# Patient Record
Sex: Female | Born: 1955 | Race: Black or African American | Hispanic: No | Marital: Married | Smoking: Never smoker
Health system: Southern US, Community
[De-identification: ages and names within clinical notes are randomized; demographics above are authoritative.]

## PROBLEM LIST (undated history)

## (undated) DIAGNOSIS — R1033 Periumbilical pain: Secondary | ICD-10-CM

## (undated) DIAGNOSIS — E7800 Pure hypercholesterolemia, unspecified: Secondary | ICD-10-CM

## (undated) DIAGNOSIS — Z789 Other specified health status: Secondary | ICD-10-CM

## (undated) DIAGNOSIS — K439 Ventral hernia without obstruction or gangrene: Principal | ICD-10-CM

## (undated) DIAGNOSIS — R110 Nausea: Secondary | ICD-10-CM

## (undated) HISTORY — PX: HERNIA REPAIR: SHX51

## (undated) HISTORY — DX: Nausea: R11.0

## (undated) HISTORY — PX: TUBAL LIGATION: SHX77

## (undated) HISTORY — PX: NO PAST SURGERIES: SHX2092

## (undated) HISTORY — DX: Ventral hernia without obstruction or gangrene: K43.9

## (undated) HISTORY — PX: COLONOSCOPY: SHX174

## (undated) HISTORY — DX: Periumbilical pain: R10.33

---

## 1998-02-03 ENCOUNTER — Emergency Department (HOSPITAL_COMMUNITY): Admission: EM | Admit: 1998-02-03 | Discharge: 1998-02-03 | Payer: Self-pay | Admitting: Emergency Medicine

## 1998-06-10 ENCOUNTER — Other Ambulatory Visit: Admission: RE | Admit: 1998-06-10 | Discharge: 1998-06-10 | Payer: Self-pay | Admitting: Obstetrics & Gynecology

## 1998-06-24 ENCOUNTER — Other Ambulatory Visit: Admission: RE | Admit: 1998-06-24 | Discharge: 1998-06-24 | Payer: Self-pay | Admitting: Obstetrics & Gynecology

## 1998-09-04 ENCOUNTER — Ambulatory Visit (HOSPITAL_COMMUNITY): Admission: RE | Admit: 1998-09-04 | Discharge: 1998-09-04 | Payer: Self-pay | Admitting: General Surgery

## 1998-09-04 ENCOUNTER — Encounter (HOSPITAL_BASED_OUTPATIENT_CLINIC_OR_DEPARTMENT_OTHER): Payer: Self-pay | Admitting: General Surgery

## 1998-09-11 ENCOUNTER — Encounter: Payer: Self-pay | Admitting: Emergency Medicine

## 1998-09-11 ENCOUNTER — Emergency Department (HOSPITAL_COMMUNITY): Admission: EM | Admit: 1998-09-11 | Discharge: 1998-09-11 | Payer: Self-pay | Admitting: Emergency Medicine

## 1998-09-12 ENCOUNTER — Encounter (HOSPITAL_BASED_OUTPATIENT_CLINIC_OR_DEPARTMENT_OTHER): Payer: Self-pay | Admitting: General Surgery

## 1998-09-16 ENCOUNTER — Ambulatory Visit (HOSPITAL_COMMUNITY): Admission: RE | Admit: 1998-09-16 | Discharge: 1998-09-16 | Payer: Self-pay | Admitting: General Surgery

## 1998-09-25 ENCOUNTER — Encounter: Admission: RE | Admit: 1998-09-25 | Discharge: 1998-10-23 | Payer: Self-pay | Admitting: Family Medicine

## 1998-10-21 ENCOUNTER — Other Ambulatory Visit: Admission: RE | Admit: 1998-10-21 | Discharge: 1998-10-21 | Payer: Self-pay | Admitting: Obstetrics and Gynecology

## 1999-02-03 ENCOUNTER — Other Ambulatory Visit: Admission: RE | Admit: 1999-02-03 | Discharge: 1999-02-03 | Payer: Self-pay | Admitting: Obstetrics and Gynecology

## 1999-05-22 ENCOUNTER — Other Ambulatory Visit: Admission: RE | Admit: 1999-05-22 | Discharge: 1999-05-22 | Payer: Self-pay | Admitting: Obstetrics and Gynecology

## 1999-11-24 ENCOUNTER — Other Ambulatory Visit: Admission: RE | Admit: 1999-11-24 | Discharge: 1999-11-24 | Payer: Self-pay | Admitting: Obstetrics and Gynecology

## 2000-07-09 ENCOUNTER — Other Ambulatory Visit: Admission: RE | Admit: 2000-07-09 | Discharge: 2000-07-09 | Payer: Self-pay | Admitting: Obstetrics and Gynecology

## 2000-07-14 ENCOUNTER — Encounter: Payer: Self-pay | Admitting: Obstetrics and Gynecology

## 2000-07-14 ENCOUNTER — Ambulatory Visit (HOSPITAL_COMMUNITY): Admission: RE | Admit: 2000-07-14 | Discharge: 2000-07-14 | Payer: Self-pay | Admitting: Obstetrics and Gynecology

## 2004-09-10 ENCOUNTER — Ambulatory Visit (HOSPITAL_COMMUNITY): Admission: RE | Admit: 2004-09-10 | Discharge: 2004-09-10 | Payer: Self-pay | Admitting: Internal Medicine

## 2010-01-27 ENCOUNTER — Other Ambulatory Visit: Admission: RE | Admit: 2010-01-27 | Discharge: 2010-01-27 | Payer: Self-pay | Admitting: Family Medicine

## 2010-02-11 ENCOUNTER — Encounter: Admission: RE | Admit: 2010-02-11 | Discharge: 2010-02-11 | Payer: Self-pay | Admitting: *Deleted

## 2011-01-08 ENCOUNTER — Other Ambulatory Visit: Payer: Self-pay | Admitting: Internal Medicine

## 2011-01-08 DIAGNOSIS — Z1231 Encounter for screening mammogram for malignant neoplasm of breast: Secondary | ICD-10-CM

## 2011-02-16 ENCOUNTER — Ambulatory Visit
Admission: RE | Admit: 2011-02-16 | Discharge: 2011-02-16 | Disposition: A | Discharge status: Home / Self Care | Payer: BC Managed Care – PPO | Source: Ambulatory Visit | Attending: Internal Medicine | Admitting: Internal Medicine

## 2011-02-16 DIAGNOSIS — Z1231 Encounter for screening mammogram for malignant neoplasm of breast: Secondary | ICD-10-CM

## 2011-02-23 ENCOUNTER — Other Ambulatory Visit: Payer: Self-pay | Admitting: Internal Medicine

## 2011-02-23 DIAGNOSIS — R928 Other abnormal and inconclusive findings on diagnostic imaging of breast: Secondary | ICD-10-CM

## 2011-02-27 ENCOUNTER — Other Ambulatory Visit: Payer: Self-pay | Admitting: Family Medicine

## 2011-03-03 ENCOUNTER — Encounter (INDEPENDENT_AMBULATORY_CARE_PROVIDER_SITE_OTHER): Payer: Self-pay | Admitting: Surgery

## 2011-03-04 ENCOUNTER — Other Ambulatory Visit: Payer: BC Managed Care – PPO

## 2011-03-10 ENCOUNTER — Ambulatory Visit
Admission: RE | Admit: 2011-03-10 | Discharge: 2011-03-10 | Disposition: A | Discharge status: Home / Self Care | Payer: BC Managed Care – PPO | Source: Ambulatory Visit | Attending: Family Medicine | Admitting: Family Medicine

## 2011-03-10 ENCOUNTER — Other Ambulatory Visit: Payer: BC Managed Care – PPO

## 2011-03-12 ENCOUNTER — Ambulatory Visit
Admission: RE | Admit: 2011-03-12 | Discharge: 2011-03-12 | Disposition: A | Discharge status: Home / Self Care | Payer: BC Managed Care – PPO | Source: Ambulatory Visit | Attending: Internal Medicine | Admitting: Internal Medicine

## 2011-03-12 DIAGNOSIS — R928 Other abnormal and inconclusive findings on diagnostic imaging of breast: Secondary | ICD-10-CM

## 2011-03-17 ENCOUNTER — Ambulatory Visit (INDEPENDENT_AMBULATORY_CARE_PROVIDER_SITE_OTHER): Payer: Self-pay | Admitting: Surgery

## 2011-04-07 ENCOUNTER — Encounter (INDEPENDENT_AMBULATORY_CARE_PROVIDER_SITE_OTHER): Payer: Self-pay | Admitting: Surgery

## 2011-04-07 ENCOUNTER — Ambulatory Visit (INDEPENDENT_AMBULATORY_CARE_PROVIDER_SITE_OTHER): Payer: BC Managed Care – PPO | Admitting: Surgery

## 2011-04-07 VITALS — BP 134/98 | HR 84 | Temp 97.3°F | Ht 65.0 in | Wt 162.6 lb

## 2011-04-07 DIAGNOSIS — K439 Ventral hernia without obstruction or gangrene: Secondary | ICD-10-CM | POA: Insufficient documentation

## 2011-04-07 HISTORY — DX: Ventral hernia without obstruction or gangrene: K43.9

## 2011-04-07 NOTE — Patient Instructions (Signed)
Call [phone] and ask for surgery scheduling when you are ready to schedule your surgery.

## 2011-04-07 NOTE — Progress Notes (Signed)
Patient ID: Alexa Wilkinson, female   DOB: 09/03/1955, 56 y.o.   MRN: [id number]  Chief Complaint  Patient presents with  . Pre-op Exam    eval peri umb abd pain    Alexa Wilkinson is a 56 y.o. female.  Referred by Dr. Cain Saupe for evaluation of painful mass above umbilicus. Alexa This patient presents with a 9 month history of worsening pain above her umbilicus.  She states that about 15 years ago she had a similar mass above her umbilicus and underwent an exploratory surgery. At that time no hernia was found. The pain went away for several years. However she has noticed worsening pain and swelling above her umbilicus. Her job requires a lot of lifting and pushing and this seems to exacerbate her symptoms. The area above her umbilicus has become quite firm and tender. She had a CT scan performed one month ago which was unremarkable other than some left kidney stones. On my review of the scan there appears to be a small soft tissue mass above the umbilicus with fat density. Past Medical History  Diagnosis Date  . Abdominal pain, periumbilic   . Nausea   . Ventral hernia 04/07/2011    History reviewed. No pertinent past surgical history.  History reviewed. No pertinent family history.  Social History History  Substance Use Topics  . Smoking status: Never Smoker   . Smokeless tobacco: Not on file  . Alcohol Use: No    Allergies  Allergen Reactions  . Penicillins Hives    Current Outpatient Prescriptions  Medication Sig Dispense Refill  . Biotin 300 MCG TABS Take by mouth.        . fish oil-omega-3 fatty acids 1000 MG capsule Take 2 g by mouth daily.        . naproxen sodium (ANAPROX) 220 MG tablet Take 220 mg by mouth 2 (two) times daily with a meal.          Review of Systems Review of Systems  Constitutional: Negative for fever, chills and unexpected weight change.  HENT: Negative for hearing loss, congestion, sore throat, trouble swallowing and voice change.   Eyes:  Negative for visual disturbance.  Respiratory: Negative for cough and wheezing.   Cardiovascular: Negative for chest pain, palpitations and leg swelling.  Gastrointestinal: Positive for abdominal pain. Negative for nausea, vomiting, diarrhea, constipation, blood in stool, abdominal distention and anal bleeding.  Genitourinary: Negative for hematuria, vaginal bleeding and difficulty urinating.  Musculoskeletal: Negative for arthralgias.  Skin: Negative for rash and wound.  Neurological: Negative for seizures, syncope and headaches.  Hematological: Negative for adenopathy. Does not bruise/bleed easily.  Psychiatric/Behavioral: Negative for confusion.    Blood pressure 134/98, pulse 84, temperature 97.3 F (36.3 C), temperature source Temporal, height 5\' 5"  (1.651 m), weight 162 lb 9.6 oz (73.755 kg), SpO2 98.00%.  Physical Exam Physical Exam WDWN in NAD HEENT:  EOMI, sclera anicteric Neck:  No masses, no thyromegaly Lungs:  CTA bilaterally; normal respiratory effort CV:  Regular rate and rhythm; no murmurs Abd:  +bowel sounds, soft, healed incision above the umbilicus.  In this area, there is an easily palpable 3 cm mass - tender to palpation, worse with Valsalva maneuver; non-reducible Ext:  Well-perfused; no edema Skin:  Warm, dry; no sign of jaundice  Data Reviewed CT scan of the abd/ pelvis 03/10/11   Assessment    Small ventral hernia above umbilicus - likely with incarcerated fat    Plan  Open ventral hernia repair, possibly with mesh.  The surgical procedure has been discussed with the patient.  Potential risks, benefits, alternative treatments, and expected outcomes have been explained.  All of the patient's questions at this time have been answered.  The likelihood of reaching the patient's treatment goal is good.  The patient understand the proposed surgical procedure and wishes to proceed. She will call back to schedule the surgery.       Antwonette Feliz  K. 04/07/2011, 4:24 PM

## 2011-05-05 ENCOUNTER — Encounter (HOSPITAL_BASED_OUTPATIENT_CLINIC_OR_DEPARTMENT_OTHER): Payer: Self-pay | Admitting: *Deleted

## 2011-05-05 NOTE — Progress Notes (Signed)
No labs ordered-none need per anesth.

## 2011-05-11 ENCOUNTER — Ambulatory Visit (HOSPITAL_BASED_OUTPATIENT_CLINIC_OR_DEPARTMENT_OTHER): Payer: BC Managed Care – PPO | Admitting: Certified Registered"

## 2011-05-11 ENCOUNTER — Encounter (HOSPITAL_BASED_OUTPATIENT_CLINIC_OR_DEPARTMENT_OTHER): Payer: Self-pay

## 2011-05-11 ENCOUNTER — Encounter (HOSPITAL_BASED_OUTPATIENT_CLINIC_OR_DEPARTMENT_OTHER)
Admission: RE | Disposition: A | Discharge status: Home / Self Care | Payer: Self-pay | Source: Ambulatory Visit | Attending: Surgery

## 2011-05-11 ENCOUNTER — Encounter (HOSPITAL_BASED_OUTPATIENT_CLINIC_OR_DEPARTMENT_OTHER): Payer: Self-pay | Admitting: Certified Registered"

## 2011-05-11 ENCOUNTER — Ambulatory Visit (HOSPITAL_BASED_OUTPATIENT_CLINIC_OR_DEPARTMENT_OTHER)
Admission: RE | Admit: 2011-05-11 | Discharge: 2011-05-11 | Disposition: A | Discharge status: Home / Self Care | Payer: BC Managed Care – PPO | Source: Ambulatory Visit | Attending: Surgery | Admitting: Surgery

## 2011-05-11 DIAGNOSIS — K439 Ventral hernia without obstruction or gangrene: Secondary | ICD-10-CM | POA: Insufficient documentation

## 2011-05-11 HISTORY — DX: Other specified health status: Z78.9

## 2011-05-11 HISTORY — PX: VENTRAL HERNIA REPAIR: SHX424

## 2011-05-11 SURGERY — REPAIR, HERNIA, VENTRAL
Anesthesia: General | Site: Abdomen | Wound class: Clean

## 2011-05-11 MED ORDER — MORPHINE SULFATE 2 MG/ML IJ SOLN: 0.0500 mg/kg | INTRAMUSCULAR | Status: DC | PRN

## 2011-05-11 MED ORDER — LACTATED RINGERS IV SOLN
INTRAVENOUS | Status: DC
  Administered 2011-05-11 (×2): via INTRAVENOUS

## 2011-05-11 MED ORDER — HYDROMORPHONE HCL PF 1 MG/ML IJ SOLN
0.2500 mg | INTRAMUSCULAR | Status: DC | PRN
  Administered 2011-05-11: 0.5 mg via INTRAVENOUS

## 2011-05-11 MED ORDER — FENTANYL CITRATE 0.05 MG/ML IJ SOLN
INTRAMUSCULAR | Status: DC | PRN
  Administered 2011-05-11: 25 ug via INTRAVENOUS
  Administered 2011-05-11: 50 ug via INTRAVENOUS

## 2011-05-11 MED ORDER — METOCLOPRAMIDE HCL 5 MG/ML IJ SOLN
INTRAMUSCULAR | Status: DC | PRN
  Administered 2011-05-11: 10 mg via INTRAVENOUS

## 2011-05-11 MED ORDER — MIDAZOLAM HCL 2 MG/2ML IJ SOLN: 0.5000 mg | INTRAMUSCULAR | Status: DC | PRN

## 2011-05-11 MED ORDER — LIDOCAINE HCL (CARDIAC) 20 MG/ML IV SOLN
INTRAVENOUS | Status: DC | PRN
  Administered 2011-05-11: 60 mg via INTRAVENOUS

## 2011-05-11 MED ORDER — DEXAMETHASONE SODIUM PHOSPHATE 4 MG/ML IJ SOLN
INTRAMUSCULAR | Status: DC | PRN
  Administered 2011-05-11: 8 mg via INTRAVENOUS

## 2011-05-11 MED ORDER — MIDAZOLAM HCL 5 MG/5ML IJ SOLN
INTRAMUSCULAR | Status: DC | PRN
  Administered 2011-05-11: 1 mg via INTRAVENOUS

## 2011-05-11 MED ORDER — ONDANSETRON HCL 4 MG/2ML IJ SOLN
INTRAMUSCULAR | Status: DC | PRN
  Administered 2011-05-11: 4 mg via INTRAVENOUS

## 2011-05-11 MED ORDER — PROPOFOL 10 MG/ML IV EMUL
INTRAVENOUS | Status: DC | PRN
  Administered 2011-05-11: 250 mg via INTRAVENOUS

## 2011-05-11 MED ORDER — METOCLOPRAMIDE HCL 5 MG/ML IJ SOLN: 10.0000 mg | Freq: Once | INTRAMUSCULAR | Status: DC | PRN

## 2011-05-11 MED ORDER — OXYCODONE-ACETAMINOPHEN 5-325 MG PO TABS: 1.0000 | ORAL_TABLET | ORAL | Status: AC | PRN

## 2011-05-11 MED ORDER — BUPIVACAINE-EPINEPHRINE 0.25% -1:200000 IJ SOLN
INTRAMUSCULAR | Status: DC | PRN
  Administered 2011-05-11: 20 mL

## 2011-05-11 MED ORDER — CEFAZOLIN SODIUM 1-5 GM-% IV SOLN
1.0000 g | INTRAVENOUS | Status: AC
  Administered 2011-05-11: 1 g via INTRAVENOUS

## 2011-05-11 MED ORDER — ACETAMINOPHEN 10 MG/ML IV SOLN
1000.0000 mg | Freq: Once | INTRAVENOUS | Status: AC
  Administered 2011-05-11: 1000 mg via INTRAVENOUS

## 2011-05-11 SURGICAL SUPPLY — 53 items
APL SKNCLS STERI-STRIP NONHPOA (GAUZE/BANDAGES/DRESSINGS)
BENZOIN TINCTURE PRP APPL 2/3 (GAUZE/BANDAGES/DRESSINGS) IMPLANT
BLADE SURG 10 STRL SS (BLADE) ×2 IMPLANT
BLADE SURG 15 STRL LF DISP TIS (BLADE) ×1 IMPLANT
BLADE SURG 15 STRL SS (BLADE) ×2
BLADE SURG ROTATE 9660 (MISCELLANEOUS) ×1 IMPLANT
CANISTER SUCTION 1200CC (MISCELLANEOUS) ×2 IMPLANT
CHLORAPREP W/TINT 26ML (MISCELLANEOUS) ×2 IMPLANT
CLEANER CAUTERY TIP 5X5 PAD (MISCELLANEOUS) ×1 IMPLANT
CLOTH BEACON ORANGE TIMEOUT ST (SAFETY) ×2 IMPLANT
COVER MAYO STAND STRL (DRAPES) ×2 IMPLANT
COVER TABLE BACK 60X90 (DRAPES) ×2 IMPLANT
DECANTER SPIKE VIAL GLASS SM (MISCELLANEOUS) ×1 IMPLANT
DRAPE LAPAROTOMY T 102X78X121 (DRAPES) ×2 IMPLANT
DRAPE UTILITY XL STRL (DRAPES) ×2 IMPLANT
DRSG TEGADERM 4X4.75 (GAUZE/BANDAGES/DRESSINGS) ×2 IMPLANT
ELECT REM PT RETURN 9FT ADLT (ELECTROSURGICAL) ×2
ELECTRODE REM PT RTRN 9FT ADLT (ELECTROSURGICAL) ×1 IMPLANT
GAUZE SPONGE 4X4 12PLY STRL LF (GAUZE/BANDAGES/DRESSINGS) ×4 IMPLANT
GLOVE BIO SURGEON STRL SZ7 (GLOVE) ×1 IMPLANT
GLOVE BIO SURGEON STRL SZ7.5 (GLOVE) ×1 IMPLANT
GLOVE BIOGEL M STRL SZ7.5 (GLOVE) ×1 IMPLANT
GLOVE BIOGEL PI IND STRL 8 (GLOVE) IMPLANT
GLOVE BIOGEL PI INDICATOR 8 (GLOVE) ×1
GOWN PREVENTION PLUS XLARGE (GOWN DISPOSABLE) ×2 IMPLANT
GOWN PREVENTION PLUS XXLARGE (GOWN DISPOSABLE) ×1 IMPLANT
NDL HYPO 25X1 1.5 SAFETY (NEEDLE) ×1 IMPLANT
NDL SPNL 25GX3.5 QUINCKE BL (NEEDLE) IMPLANT
NEEDLE HYPO 25X1 1.5 SAFETY (NEEDLE) ×2 IMPLANT
NEEDLE SPNL 25GX3.5 QUINCKE BL (NEEDLE) IMPLANT
NS IRRIG 1000ML POUR BTL (IV SOLUTION) ×1 IMPLANT
PACK BASIN DAY SURGERY FS (CUSTOM PROCEDURE TRAY) ×2 IMPLANT
PAD CLEANER CAUTERY TIP 5X5 (MISCELLANEOUS) ×1
PENCIL BUTTON HOLSTER BLD 10FT (ELECTRODE) ×2 IMPLANT
SLEEVE SCD COMPRESS KNEE MED (MISCELLANEOUS) ×1 IMPLANT
SPONGE LAP 18X18 X RAY DECT (DISPOSABLE) ×2 IMPLANT
STAPLER VISISTAT 35W (STAPLE) IMPLANT
STRIP CLOSURE SKIN 1/2X4 (GAUZE/BANDAGES/DRESSINGS) IMPLANT
SUT MNCRL AB 4-0 PS2 18 (SUTURE) ×2 IMPLANT
SUT PROLENE 0 CT 1 CR/8 (SUTURE) IMPLANT
SUT PROLENE 2 0 CT2 30 (SUTURE) IMPLANT
SUT SILK 2 0 TIES 17X18 (SUTURE)
SUT SILK 2-0 18XBRD TIE BLK (SUTURE) IMPLANT
SUT SURG 0 T 19/GS 22 1969 62 (SUTURE) ×1 IMPLANT
SUT VIC AB 3-0 SH 27 (SUTURE) ×2
SUT VIC AB 3-0 SH 27X BRD (SUTURE) ×1 IMPLANT
SYR BULB 3OZ (MISCELLANEOUS) IMPLANT
SYR CONTROL 10ML LL (SYRINGE) ×2 IMPLANT
TOWEL OR 17X24 6PK STRL BLUE (TOWEL DISPOSABLE) ×2 IMPLANT
TOWEL OR NON WOVEN STRL DISP B (DISPOSABLE) ×2 IMPLANT
TUBE CONNECTING 20X1/4 (TUBING) ×2 IMPLANT
WATER STERILE IRR 1000ML POUR (IV SOLUTION) ×1 IMPLANT
YANKAUER SUCT BULB TIP NO VENT (SUCTIONS) ×2 IMPLANT

## 2011-05-11 NOTE — Transfer of Care (Signed)
Immediate Anesthesia Transfer of Care Note  Patient: Alexa Wilkinson  Procedure(s) Performed:  HERNIA REPAIR VENTRAL ADULT - open vental hernia repair  Patient Location: PACU  Anesthesia Type: General  Level of Consciousness: awake, alert , oriented and patient cooperative  Airway & Oxygen Therapy: Patient Spontanous Breathing and Patient connected to face mask oxygen  Post-op Assessment: Report given to PACU RN and Post -op Vital signs reviewed and stable  Post vital signs: Reviewed and stable  Complications: No apparent anesthesia complications

## 2011-05-11 NOTE — H&P (Signed)
Progress Notes   ANAHLIA ISEMINGER (MR# [id number])       Progress Notes Info       Author Note Status Last Update User Last Update Date/Time    Wilmon Arms. Corliss Skains, MD Signed Wilmon Arms. Corliss Skains, MD 04/07/2011  4:30 PM      Progress Notes     Patient ID: Alexa Wilkinson, female   DOB: 1955/04/24, 56 y.o.   MRN: [id number]    Chief Complaint   Patient presents with   .  Pre-op Exam       eval peri umb abd pain        HPI Alexa Wilkinson is a 56 y.o. female.  Referred by Dr. Cain Saupe for evaluation of painful mass above umbilicus. HPI This patient presents with a 9 month history of worsening pain above her umbilicus.  She states that about 15 years ago she had a similar mass above her umbilicus and underwent an exploratory surgery. At that time no hernia was found. The pain went away for several years. However she has noticed worsening pain and swelling above her umbilicus. Her job requires a lot of lifting and pushing and this seems to exacerbate her symptoms. The area above her umbilicus has become quite firm and tender. She had a CT scan performed one month ago which was unremarkable other than some left kidney stones. On my review of the scan there appears to be a small soft tissue mass above the umbilicus with fat density. Past Medical History   Diagnosis  Date   .  Abdominal pain, periumbilic     .  Nausea     .  Ventral hernia  04/07/2011        PSH - exploratory surgery for umbilical mass   History reviewed. No pertinent family history.   Social History History   Substance Use Topics   .  Smoking status:  Never Smoker    .  Smokeless tobacco:  Not on file   .  Alcohol Use:  No         Allergies   Allergen  Reactions   .  Penicillins  Hives         Current Outpatient Prescriptions   Medication  Sig  Dispense  Refill   .  Biotin 300 MCG TABS  Take by mouth.           .  fish oil-omega-3 fatty acids 1000 MG capsule  Take 2 g by mouth daily.           .   naproxen sodium (ANAPROX) 220 MG tablet  Take 220 mg by mouth 2 (two) times daily with a meal.                Review of Systems Review of Systems  Constitutional: Negative for fever, chills and unexpected weight change.  HENT: Negative for hearing loss, congestion, sore throat, trouble swallowing and voice change.   Eyes: Negative for visual disturbance.  Respiratory: Negative for cough and wheezing.   Cardiovascular: Negative for chest pain, palpitations and leg swelling.  Gastrointestinal: Positive for abdominal pain. Negative for nausea, vomiting, diarrhea, constipation, blood in stool, abdominal distention and anal bleeding.  Genitourinary: Negative for hematuria, vaginal bleeding and difficulty urinating.  Musculoskeletal: Negative for arthralgias.  Skin: Negative for rash and wound.  Neurological: Negative for seizures, syncope and headaches.  Hematological: Negative for adenopathy. Does not bruise/bleed easily.  Psychiatric/Behavioral: Negative for confusion.  Blood pressure 134/98, pulse 84, temperature 97.3 F (36.3 C), temperature source Temporal, height 5\' 5"  (1.651 m), weight 162 lb 9.6 oz (73.755 kg), SpO2 98.00%.   Physical Exam  WDWN in NAD HEENT:  EOMI, sclera anicteric Neck:  No masses, no thyromegaly Lungs:  CTA bilaterally; normal respiratory effort CV:  Regular rate and rhythm; no murmurs Abd:  +bowel sounds, soft, healed incision above the umbilicus.  In this area, there is an easily palpable 3 cm mass - tender to palpation, worse with Valsalva maneuver; non-reducible Ext:  Well-perfused; no edema Skin:  Warm, dry; no sign of jaundice   Data Reviewed CT scan of the abd/ pelvis 03/10/11      Assessment    Small ventral hernia above umbilicus - likely with incarcerated fat     Plan    Open ventral hernia repair, possibly with mesh.  The surgical procedure has been discussed with the patient.  Potential risks, benefits, alternative treatments,  and expected outcomes have been explained.  All of the patient's questions at this time have been answered.  The likelihood of reaching the patient's treatment goal is good.  The patient understand the proposed surgical procedure and wishes to proceed.      Wilmon Arms. Corliss Skains, MD, [facility] Surgery  05/11/2011 11:25 AM

## 2011-05-11 NOTE — Anesthesia Procedure Notes (Signed)
Procedure Name: LMA Insertion Date/Time: 05/11/2011 2:17 PM Performed by: Radford Pax Pre-anesthesia Checklist: Patient identified, Emergency Drugs available, Suction available, Patient being monitored and Timeout performed Patient Re-evaluated:Patient Re-evaluated prior to inductionOxygen Delivery Method: Circle System Utilized Preoxygenation: Pre-oxygenation with 100% oxygen Intubation Type: IV induction Ventilation: Mask ventilation without difficulty LMA: LMA inserted LMA Size: 4.0 Number of attempts: 1 (atraumatic) Airway Equipment and Method: bite block Placement Confirmation: positive ETCO2 Tube secured with: Tape Dental Injury: Teeth and Oropharynx as per pre-operative assessment

## 2011-05-11 NOTE — Anesthesia Postprocedure Evaluation (Signed)
Anesthesia Post Note  Patient: Alexa Wilkinson  Procedure(s) Performed: Procedure(s) (LRB): HERNIA REPAIR VENTRAL ADULT (N/A)  Anesthesia type: General  Patient location: PACU  Post pain: Pain level controlled  Post assessment: Patient's Cardiovascular Status Stable  Last Vitals:  Filed Vitals:   05/11/11 1545  BP: 138/78  Pulse: 66  Temp:   Resp: 13    Post vital signs: Reviewed and stable  Level of consciousness: alert  Complications: No apparent anesthesia complications

## 2011-05-11 NOTE — Op Note (Signed)
Indications:  The patient presented with a history of a supraumbilical hernia.  The patient was examined and we recommended ventral hernia repair with mesh.  Pre-operative diagnosis:  Supraumbilical ventral hernia  Post-operative diagnosis:  Same  Surgeon: Dartha Rozzell K.   Procedure:  Open repair of supraumbilical ventral hernia  Assistants: None  Anesthesia: General LMA anesthesia  ASA Class: 2   Procedure Details  The patient was seen again in the Holding Room. The risks, benefits, complications, treatment options, and expected outcomes were discussed with the patient. The possibilities of reaction to medication, pulmonary aspiration, perforation of viscus, bleeding, recurrent infection, the need for additional procedures, and development of a complication requiring transfusion or further operation were discussed with the patient and/or family. There was concurrence with the proposed plan, and informed consent was obtained. The site of surgery was properly noted/marked. The patient was taken to the Operating Room, identified as Alexa Wilkinson, and the procedure verified as umbilical hernia repair. A Time Out was held and the above information confirmed.  After an adequate level of general anesthesia was obtained, the patient's abdomen was prepped with Chloraprep and draped in sterile fashion.  We made a vertical incision above the umbilicus, including her previous incision.  Dissection was carried down to the hernia sac with cautery.  We dissected bluntly around the hernia sac down to the edge of the fascial defect.  The hernia sac contained only some preperitoneal fat, but measured about 2.5 cm across. The fascial defect measured 0.5 cm.  We cleared the fascia in all directions.  The fascial defect was closed with multiple interrupted figure-of-eight 1 Novofil sutures.  3-0 Vicryl was used to close the subcutaneous tissues and 4-0 Monocryl was used to close the skin.  Steri-strips and clean  dressing were applied.  The patient was extubated and brought to the recovery room in stable condition.  All sponge, instrument, and needle counts were correct prior to closure and at the conclusion of the case.   Estimated Blood Loss: Minimal          Complications: None; patient tolerated the procedure well.         Disposition: PACU - hemodynamically stable.         Condition: stable

## 2011-05-11 NOTE — Anesthesia Preprocedure Evaluation (Signed)
Anesthesia Evaluation  Patient identified by MRN, date of birth, ID band Patient awake    Reviewed: Allergy & Precautions, H&P , NPO status , Patient's Chart, lab work & pertinent test results, reviewed documented beta blocker date and time   Airway Mallampati: II TM Distance: >3 FB Neck ROM: full    Dental   Pulmonary neg pulmonary ROS,          Cardiovascular neg cardio ROS     Neuro/Psych Negative Neurological ROS  Negative Psych ROS   GI/Hepatic negative GI ROS, Neg liver ROS,   Endo/Other  Negative Endocrine ROS  Renal/GU negative Renal ROS  Genitourinary negative   Musculoskeletal   Abdominal   Peds  Hematology negative hematology ROS (+)   Anesthesia Other Findings See surgeon's H&P   Reproductive/Obstetrics negative OB ROS                           Anesthesia Physical Anesthesia Plan  ASA: II  Anesthesia Plan: General   Post-op Pain Management:    Induction: Intravenous  Airway Management Planned: LMA  Additional Equipment:   Intra-op Plan:   Post-operative Plan: Extubation in OR  Informed Consent: I have reviewed the patients History and Physical, chart, labs and discussed the procedure including the risks, benefits and alternatives for the proposed anesthesia with the patient or authorized representative who has indicated his/her understanding and acceptance.     Plan Discussed with: CRNA and Surgeon  Anesthesia Plan Comments:         Anesthesia Quick Evaluation  

## 2011-05-12 ENCOUNTER — Encounter (HOSPITAL_BASED_OUTPATIENT_CLINIC_OR_DEPARTMENT_OTHER): Payer: Self-pay | Admitting: Surgery

## 2011-05-12 LAB — POCT HEMOGLOBIN-HEMACUE: Hemoglobin: 12.7 g/dL (ref 12.0–15.0)

## 2011-05-26 ENCOUNTER — Encounter (INDEPENDENT_AMBULATORY_CARE_PROVIDER_SITE_OTHER): Payer: Self-pay | Admitting: Surgery

## 2011-05-26 ENCOUNTER — Encounter (INDEPENDENT_AMBULATORY_CARE_PROVIDER_SITE_OTHER): Payer: Self-pay | Admitting: General Surgery

## 2011-05-26 ENCOUNTER — Ambulatory Visit (INDEPENDENT_AMBULATORY_CARE_PROVIDER_SITE_OTHER): Payer: BC Managed Care – PPO | Admitting: Surgery

## 2011-05-26 VITALS — BP 146/94 | HR 70 | Temp 98.2°F | Resp 18 | Ht 66.0 in | Wt 158.2 lb

## 2011-05-26 DIAGNOSIS — K439 Ventral hernia without obstruction or gangrene: Secondary | ICD-10-CM

## 2011-05-26 NOTE — Progress Notes (Signed)
S/p open repair of small ventral hernia on 2/11.  This was repaired with suture.  Doing well.  Incision well-healed with no sign of infection.  No sign of recurrent hernia.  Resume full activity.  Follow-up PRN.  Wilmon Arms. Corliss Skains, MD, [facility] Surgery  05/26/2011 2:09 PM

## 2012-02-04 ENCOUNTER — Other Ambulatory Visit: Payer: Self-pay | Admitting: Family Medicine

## 2012-02-04 DIAGNOSIS — Z1231 Encounter for screening mammogram for malignant neoplasm of breast: Secondary | ICD-10-CM

## 2012-03-18 ENCOUNTER — Ambulatory Visit: Payer: BC Managed Care – PPO

## 2012-03-21 ENCOUNTER — Ambulatory Visit: Payer: BC Managed Care – PPO

## 2012-03-31 ENCOUNTER — Ambulatory Visit
Admission: RE | Admit: 2012-03-31 | Discharge: 2012-03-31 | Disposition: A | Discharge status: Home / Self Care | Payer: BC Managed Care – PPO | Source: Ambulatory Visit | Attending: Family Medicine | Admitting: Family Medicine

## 2012-03-31 DIAGNOSIS — Z1231 Encounter for screening mammogram for malignant neoplasm of breast: Secondary | ICD-10-CM

## 2012-04-01 ENCOUNTER — Ambulatory Visit: Payer: BC Managed Care – PPO

## 2012-06-02 IMAGING — CT CT ABD-PELV W/O CM
3 of 4 series · 13 of 36 positions shown, 19 images · IV contrast (READICAT/WATER)
Comparison: None.

CLINICAL DATA: Periumbilical pain for several months

CT ABDOMEN AND PELVIS WITHOUT CONTRAST
TECHNIQUE: Multidetector CT imaging of the abdomen and pelvis was
performed following the standard protocol without intravenous
contrast.

[Series 3: abd/pelvis w/o · axial · non-contrast · 0.70mm/px · z∈[-307,-7]mm · 8 of 78 slices shown, 13 images]
[im 9/78  soft-tissue]
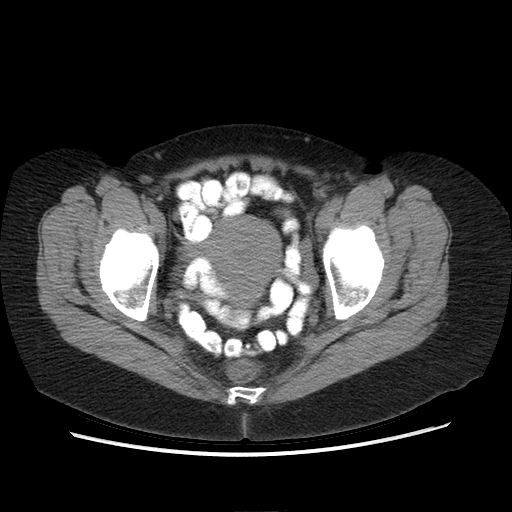
[im 9/78  bone]
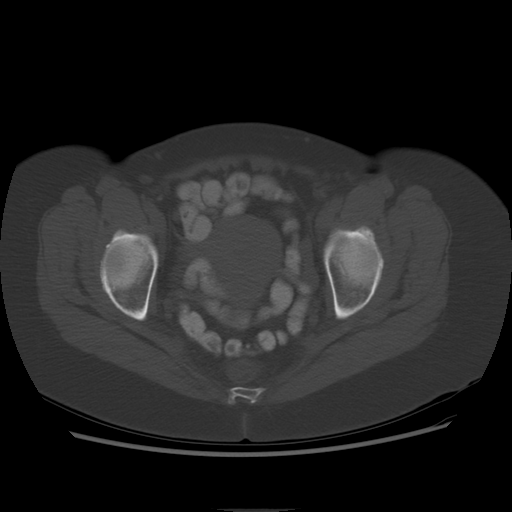
[im 18/78  soft-tissue]
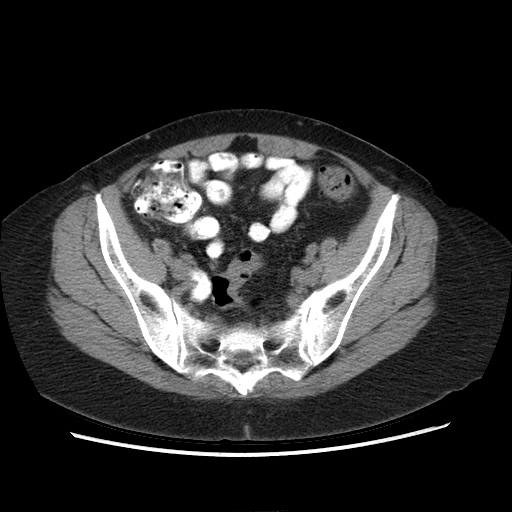
[im 26/78  soft-tissue]
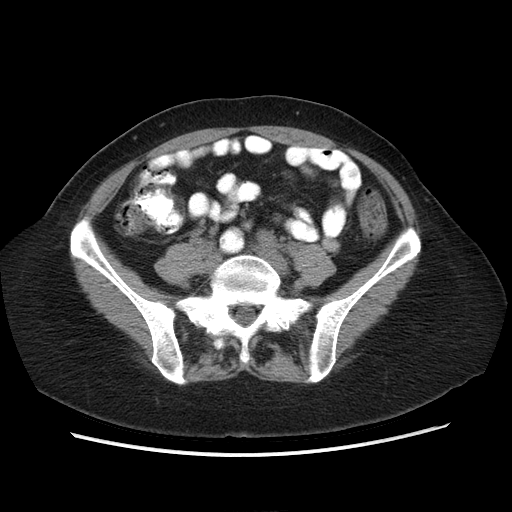
[im 35/78  soft-tissue]
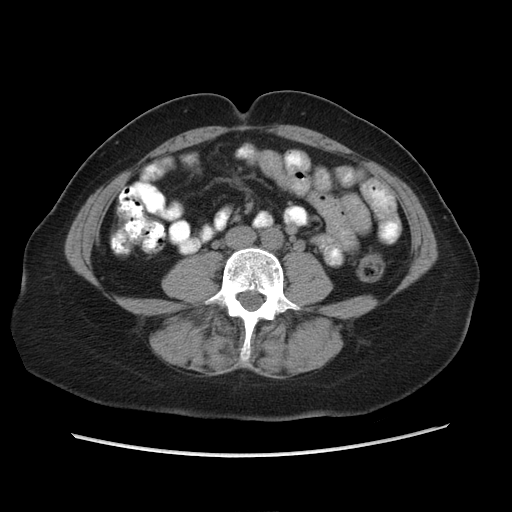
[im 43/78  soft-tissue]
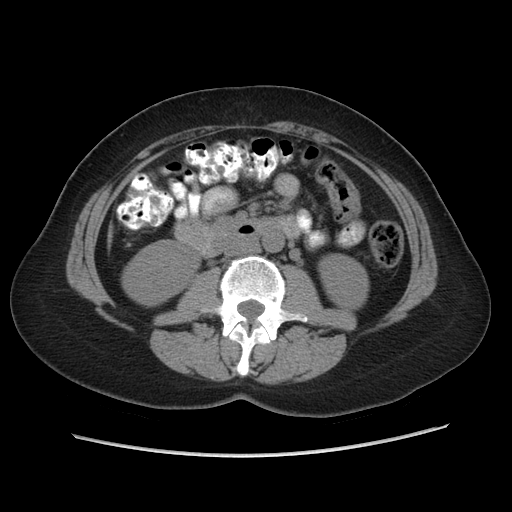
[im 43/78  lung]
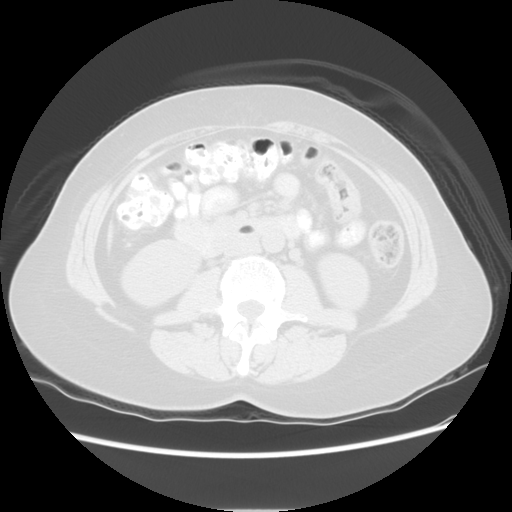
[im 52/78  soft-tissue]
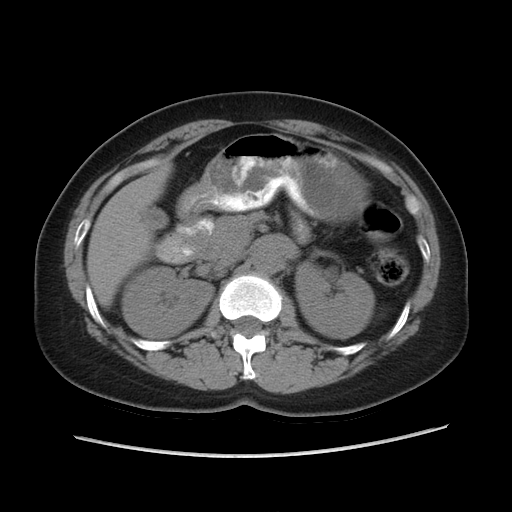
[im 52/78  lung]
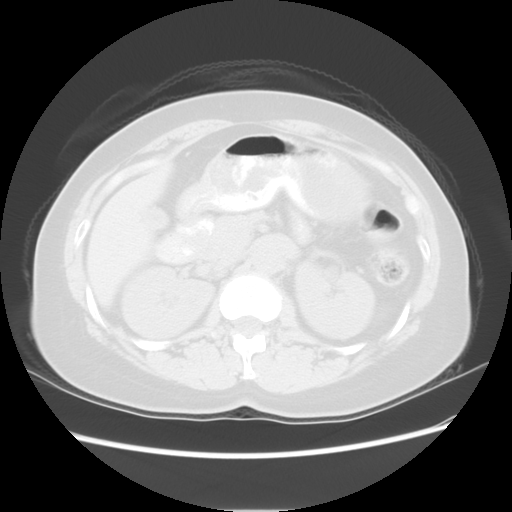
[im 60/78  soft-tissue]
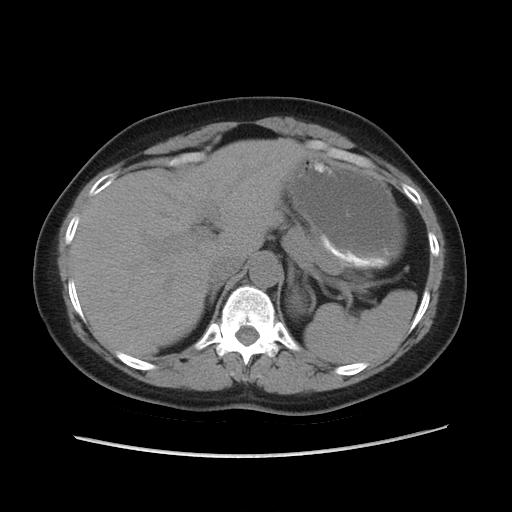
[im 60/78  lung]
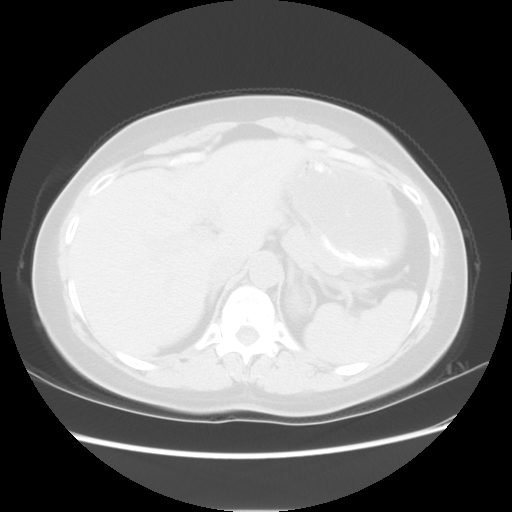
[im 69/78  soft-tissue]
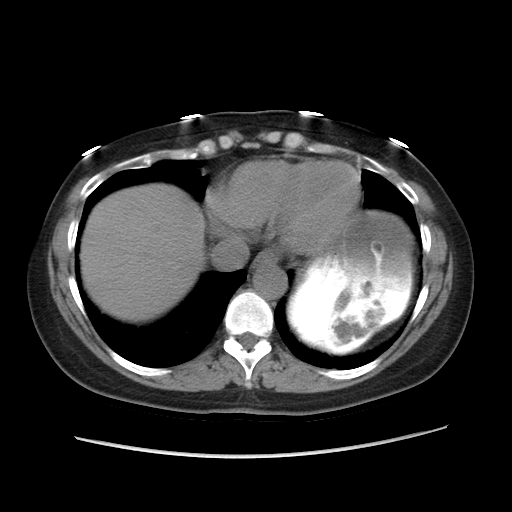
[im 69/78  lung]
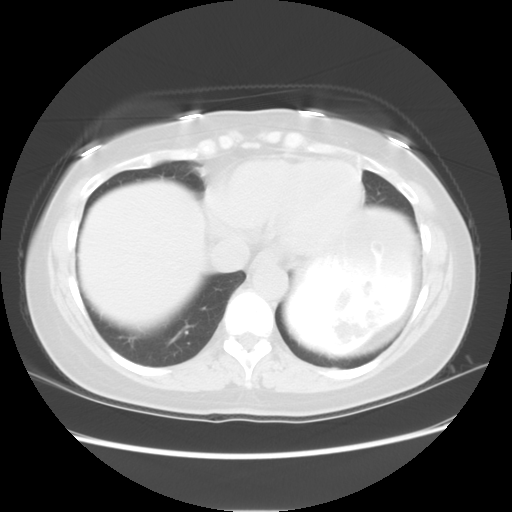

[Series 601: coronal body · coronal · 0.84mm/px · 1 of 107 slices shown, 2 images]
[im 36/107  soft-tissue]
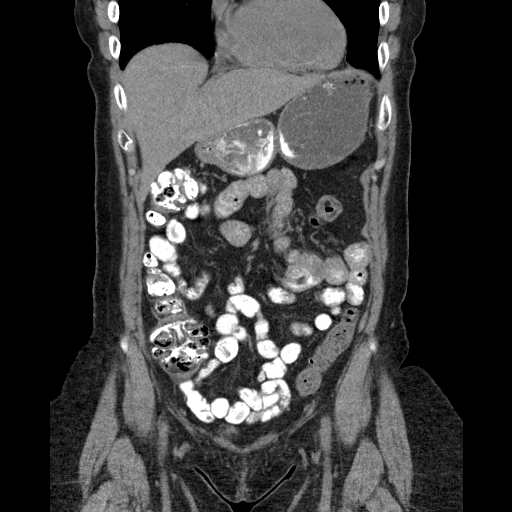
[im 36/107  bone]
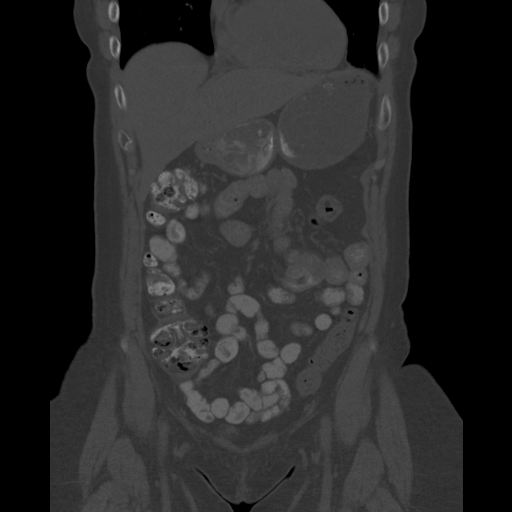

[Series 602: sagittal body · sagittal · 0.84mm/px · 4 of 145 slices shown]
[im 17/145  soft-tissue]
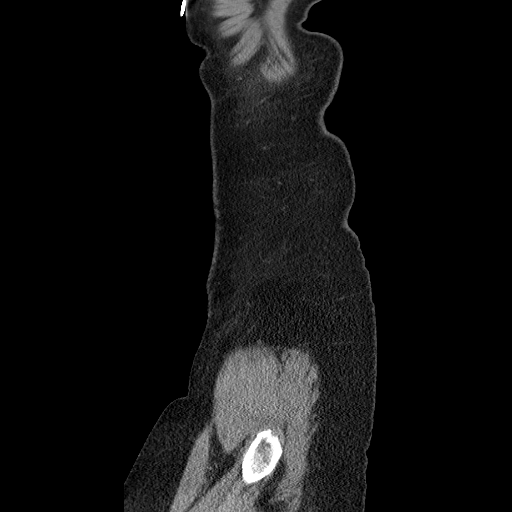
[im 33/145  soft-tissue]
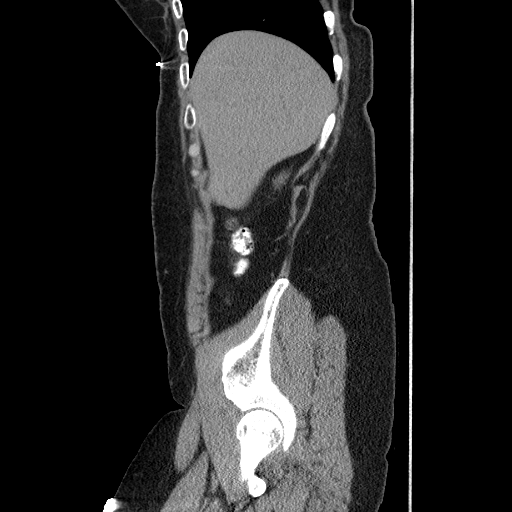
[im 49/145  soft-tissue]
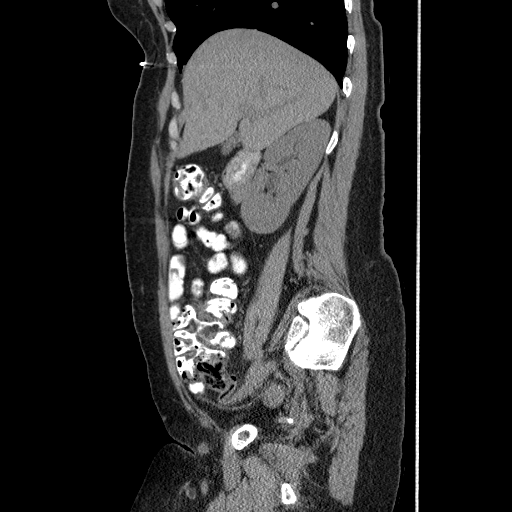
[im 65/145  soft-tissue]
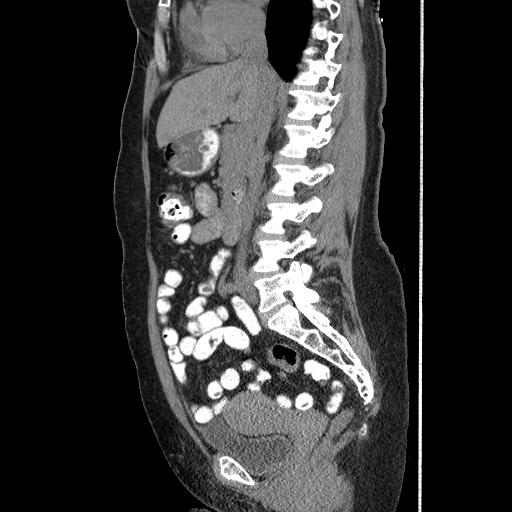

[13 of 36 positions shown; findings below may reference images not displayed]

FINDINGS: The lung bases are clear.  The liver is unremarkable in
the unenhanced state.  The gallbladder is contracted and no
calcified gallstones are seen.  The pancreas is normal in size and
the pancreatic duct is not dilated.  The adrenal glands and spleen
are unremarkable.  The stomach is moderately fluid distended with
contrast media and is unremarkable.  Small nonobstructing left
renal calculi are seen.  No definite right renal calculi are noted.
The ureters are normal in caliber.  The abdominal aorta appears
normal.  No adenopathy is seen.

No distal ureteral calculi are noted.  The uterus is normal in
size.  The urinary bladder is not well distended.  No free fluid is
seen within the pelvis.  There are rectosigmoid colonic diverticula
present. The appendix and terminal ileum are unremarkable.  No bony
abnormality is seen.
IMPRESSION: 1.  Small nonobstructing left renal calculi.  No right renal
calculi.  No hydronephrosis.
2.  The appendix and terminal ileum appear normal.
3.  Rectosigmoid colonic diverticula.

## 2013-02-16 ENCOUNTER — Other Ambulatory Visit (HOSPITAL_COMMUNITY)
Admission: RE | Admit: 2013-02-16 | Discharge: 2013-02-16 | Disposition: A | Discharge status: Home / Self Care | Payer: BC Managed Care – PPO | Source: Ambulatory Visit | Attending: Family Medicine | Admitting: Family Medicine

## 2013-02-16 ENCOUNTER — Other Ambulatory Visit: Payer: Self-pay | Admitting: Family Medicine

## 2013-02-16 DIAGNOSIS — Z0000 Encounter for general adult medical examination without abnormal findings: Secondary | ICD-10-CM | POA: Insufficient documentation

## 2013-02-17 ENCOUNTER — Other Ambulatory Visit: Payer: Self-pay | Admitting: Family Medicine

## 2013-02-17 DIAGNOSIS — N949 Unspecified condition associated with female genital organs and menstrual cycle: Secondary | ICD-10-CM

## 2013-02-27 ENCOUNTER — Ambulatory Visit
Admission: RE | Admit: 2013-02-27 | Discharge: 2013-02-27 | Disposition: A | Discharge status: Home / Self Care | Payer: BC Managed Care – PPO | Source: Ambulatory Visit | Attending: Family Medicine | Admitting: Family Medicine

## 2013-02-27 DIAGNOSIS — N949 Unspecified condition associated with female genital organs and menstrual cycle: Secondary | ICD-10-CM

## 2013-03-21 ENCOUNTER — Other Ambulatory Visit: Payer: Self-pay

## 2013-03-21 DIAGNOSIS — Z1231 Encounter for screening mammogram for malignant neoplasm of breast: Secondary | ICD-10-CM

## 2013-04-17 ENCOUNTER — Ambulatory Visit: Payer: BC Managed Care – PPO

## 2013-04-26 ENCOUNTER — Ambulatory Visit
Admission: RE | Admit: 2013-04-26 | Discharge: 2013-04-26 | Disposition: A | Discharge status: Home / Self Care | Payer: Self-pay | Source: Ambulatory Visit

## 2013-04-26 ENCOUNTER — Other Ambulatory Visit: Payer: Self-pay

## 2013-04-26 DIAGNOSIS — Z1231 Encounter for screening mammogram for malignant neoplasm of breast: Secondary | ICD-10-CM

## 2014-03-27 ENCOUNTER — Other Ambulatory Visit: Payer: Self-pay

## 2014-03-27 DIAGNOSIS — Z1231 Encounter for screening mammogram for malignant neoplasm of breast: Secondary | ICD-10-CM

## 2014-04-27 ENCOUNTER — Encounter (INDEPENDENT_AMBULATORY_CARE_PROVIDER_SITE_OTHER): Payer: Self-pay

## 2014-04-27 ENCOUNTER — Ambulatory Visit
Admission: RE | Admit: 2014-04-27 | Discharge: 2014-04-27 | Disposition: A | Discharge status: Home / Self Care | Payer: BC Managed Care – PPO | Source: Ambulatory Visit

## 2014-04-27 DIAGNOSIS — Z1231 Encounter for screening mammogram for malignant neoplasm of breast: Secondary | ICD-10-CM

## 2015-04-02 ENCOUNTER — Other Ambulatory Visit: Payer: Self-pay

## 2015-04-02 DIAGNOSIS — Z1231 Encounter for screening mammogram for malignant neoplasm of breast: Secondary | ICD-10-CM

## 2015-04-29 ENCOUNTER — Ambulatory Visit: Payer: BC Managed Care – PPO

## 2015-04-29 ENCOUNTER — Ambulatory Visit
Admission: RE | Admit: 2015-04-29 | Discharge: 2015-04-29 | Disposition: A | Discharge status: Home / Self Care | Payer: BC Managed Care – PPO | Source: Ambulatory Visit

## 2015-04-29 DIAGNOSIS — Z1231 Encounter for screening mammogram for malignant neoplasm of breast: Secondary | ICD-10-CM

## 2016-02-28 ENCOUNTER — Other Ambulatory Visit (HOSPITAL_COMMUNITY)
Admission: RE | Admit: 2016-02-28 | Discharge: 2016-02-28 | Disposition: A | Discharge status: Home / Self Care | Payer: BC Managed Care – PPO | Source: Ambulatory Visit | Attending: Pediatrics | Admitting: Pediatrics

## 2016-02-28 ENCOUNTER — Other Ambulatory Visit: Payer: Self-pay

## 2016-02-28 DIAGNOSIS — Z01419 Encounter for gynecological examination (general) (routine) without abnormal findings: Secondary | ICD-10-CM | POA: Insufficient documentation

## 2016-03-02 LAB — CYTOLOGY - PAP: Diagnosis: NEGATIVE

## 2016-04-08 ENCOUNTER — Other Ambulatory Visit: Payer: Self-pay | Admitting: Family

## 2016-04-08 DIAGNOSIS — Z1231 Encounter for screening mammogram for malignant neoplasm of breast: Secondary | ICD-10-CM

## 2016-05-01 ENCOUNTER — Ambulatory Visit
Admission: RE | Admit: 2016-05-01 | Discharge: 2016-05-01 | Disposition: A | Discharge status: Home / Self Care | Payer: BC Managed Care – PPO | Source: Ambulatory Visit | Attending: Family | Admitting: Family

## 2016-05-01 DIAGNOSIS — Z1231 Encounter for screening mammogram for malignant neoplasm of breast: Secondary | ICD-10-CM

## 2017-04-07 ENCOUNTER — Other Ambulatory Visit: Payer: Self-pay | Admitting: Nurse Practitioner

## 2017-04-07 DIAGNOSIS — Z1231 Encounter for screening mammogram for malignant neoplasm of breast: Secondary | ICD-10-CM

## 2017-05-03 ENCOUNTER — Ambulatory Visit
Admission: RE | Admit: 2017-05-03 | Discharge: 2017-05-03 | Disposition: A | Discharge status: Home / Self Care | Payer: BC Managed Care – PPO | Source: Ambulatory Visit | Attending: Nurse Practitioner | Admitting: Nurse Practitioner

## 2017-05-03 DIAGNOSIS — Z1231 Encounter for screening mammogram for malignant neoplasm of breast: Secondary | ICD-10-CM

## 2018-03-31 ENCOUNTER — Other Ambulatory Visit: Payer: Self-pay | Admitting: Nurse Practitioner

## 2018-03-31 DIAGNOSIS — Z1231 Encounter for screening mammogram for malignant neoplasm of breast: Secondary | ICD-10-CM

## 2018-05-06 ENCOUNTER — Ambulatory Visit: Payer: BC Managed Care – PPO

## 2018-06-01 ENCOUNTER — Ambulatory Visit
Admission: RE | Admit: 2018-06-01 | Discharge: 2018-06-01 | Disposition: A | Discharge status: Home / Self Care | Payer: BC Managed Care – PPO | Source: Ambulatory Visit | Attending: Nurse Practitioner | Admitting: Nurse Practitioner

## 2018-06-01 DIAGNOSIS — Z1231 Encounter for screening mammogram for malignant neoplasm of breast: Secondary | ICD-10-CM

## 2019-02-22 ENCOUNTER — Other Ambulatory Visit: Payer: Self-pay | Admitting: Internal Medicine

## 2019-02-22 ENCOUNTER — Other Ambulatory Visit (HOSPITAL_COMMUNITY)
Admission: RE | Admit: 2019-02-22 | Discharge: 2019-02-22 | Disposition: A | Discharge status: Home / Self Care | Payer: BC Managed Care – PPO | Source: Ambulatory Visit | Attending: Internal Medicine | Admitting: Internal Medicine

## 2019-02-22 DIAGNOSIS — Z01419 Encounter for gynecological examination (general) (routine) without abnormal findings: Secondary | ICD-10-CM | POA: Diagnosis not present

## 2019-02-22 DIAGNOSIS — Z1231 Encounter for screening mammogram for malignant neoplasm of breast: Secondary | ICD-10-CM

## 2019-02-22 DIAGNOSIS — E2839 Other primary ovarian failure: Secondary | ICD-10-CM

## 2019-03-05 LAB — CYTOLOGY - PAP
Comment: NEGATIVE
Comment: NEGATIVE
Diagnosis: NEGATIVE
HPV 16: NEGATIVE
HPV 18 / 45: NEGATIVE
High risk HPV: POSITIVE — AB

## 2019-05-13 ENCOUNTER — Ambulatory Visit: Payer: BC Managed Care – PPO | Attending: Internal Medicine

## 2019-05-13 DIAGNOSIS — Z23 Encounter for immunization: Secondary | ICD-10-CM | POA: Insufficient documentation

## 2019-05-13 NOTE — Progress Notes (Signed)
   Covid-19 Vaccination Clinic  Name:  JENISHA FAISON    MRN: [id number] DOB: 1955/05/08  05/13/2019  Ms. Wedel was observed post Covid-19 immunization for 15 minutes without incidence. She was provided with Vaccine Information Sheet and instruction to access the V-Safe system.   Ms. Weyers was instructed to call 911 with any severe reactions post vaccine: Marland Kitchen Difficulty breathing  . Swelling of your face and throat  . A fast heartbeat  . A bad rash all over your body  . Dizziness and weakness    Immunizations Administered    Name Date Dose VIS Date Route   Pfizer COVID-19 Vaccine 05/13/2019 10:14 AM 0.3 mL 03/10/2019 Intramuscular   Manufacturer: [employer], [employer]   Lot: [id number]   NDC: [address]-4

## 2019-06-04 ENCOUNTER — Ambulatory Visit: Payer: BC Managed Care – PPO | Attending: Internal Medicine

## 2019-06-04 DIAGNOSIS — Z23 Encounter for immunization: Secondary | ICD-10-CM | POA: Insufficient documentation

## 2019-06-04 NOTE — Progress Notes (Signed)
   Covid-19 Vaccination Clinic  Name:  Alexa Wilkinson    MRN: [id number] DOB: [birth year]  06/04/2019  Alexa Wilkinson was observed post Covid-19 immunization for 15 minutes without incident. She was provided with Vaccine Information Sheet and instruction to access the V-Safe system.   Alexa Wilkinson was instructed to call 911 with any severe reactions post vaccine: Marland Kitchen Difficulty breathing  . Swelling of face and throat  . A fast heartbeat  . A bad rash all over body  . Dizziness and weakness   Immunizations Administered    Name Date Dose VIS Date Route   Pfizer COVID-19 Vaccine 06/04/2019  2:47 PM 0.3 mL 03/10/2019 Intramuscular   Manufacturer: [employer], [employer]   Lot: [id number]   NDC: [address]-1

## 2019-06-05 ENCOUNTER — Other Ambulatory Visit: Payer: BC Managed Care – PPO

## 2019-06-05 ENCOUNTER — Ambulatory Visit: Payer: BC Managed Care – PPO

## 2019-08-01 ENCOUNTER — Other Ambulatory Visit: Payer: Self-pay

## 2019-08-01 ENCOUNTER — Ambulatory Visit
Admission: RE | Admit: 2019-08-01 | Discharge: 2019-08-01 | Disposition: A | Discharge status: Home / Self Care | Payer: BC Managed Care – PPO | Source: Ambulatory Visit | Attending: Internal Medicine | Admitting: Internal Medicine

## 2019-08-01 DIAGNOSIS — Z1231 Encounter for screening mammogram for malignant neoplasm of breast: Secondary | ICD-10-CM

## 2019-08-01 DIAGNOSIS — E2839 Other primary ovarian failure: Secondary | ICD-10-CM

## 2020-06-28 ENCOUNTER — Other Ambulatory Visit: Payer: Self-pay | Admitting: Internal Medicine

## 2020-06-28 DIAGNOSIS — Z1231 Encounter for screening mammogram for malignant neoplasm of breast: Secondary | ICD-10-CM

## 2020-06-30 ENCOUNTER — Other Ambulatory Visit: Payer: Self-pay

## 2020-06-30 ENCOUNTER — Encounter (HOSPITAL_BASED_OUTPATIENT_CLINIC_OR_DEPARTMENT_OTHER): Payer: Self-pay | Admitting: *Deleted

## 2020-06-30 ENCOUNTER — Emergency Department (HOSPITAL_BASED_OUTPATIENT_CLINIC_OR_DEPARTMENT_OTHER)
Admission: EM | Admit: 2020-06-30 | Discharge: 2020-06-30 | Disposition: A | Discharge status: Home / Self Care | Payer: BC Managed Care – PPO | Attending: Emergency Medicine | Admitting: Emergency Medicine

## 2020-06-30 ENCOUNTER — Emergency Department (HOSPITAL_BASED_OUTPATIENT_CLINIC_OR_DEPARTMENT_OTHER): Payer: BC Managed Care – PPO

## 2020-06-30 DIAGNOSIS — R072 Precordial pain: Secondary | ICD-10-CM

## 2020-06-30 HISTORY — DX: Pure hypercholesterolemia, unspecified: E78.00

## 2020-06-30 LAB — CBC WITH DIFFERENTIAL/PLATELET
Abs Immature Granulocytes: 0.01 10*3/uL (ref 0.00–0.07)
Basophils Absolute: 0 10*3/uL (ref 0.0–0.1)
Basophils Relative: 0 %
Eosinophils Absolute: 0.1 10*3/uL (ref 0.0–0.5)
Eosinophils Relative: 1 %
HCT: 44.1 % (ref 36.0–46.0)
Hemoglobin: 14.1 g/dL (ref 12.0–15.0)
Immature Granulocytes: 0 %
Lymphocytes Relative: 19 %
Lymphs Abs: 1 10*3/uL (ref 0.7–4.0)
MCH: 28 pg (ref 26.0–34.0)
MCHC: 32 g/dL (ref 30.0–36.0)
MCV: 87.7 fL (ref 80.0–100.0)
Monocytes Absolute: 0.4 10*3/uL (ref 0.1–1.0)
Monocytes Relative: 8 %
Neutro Abs: 3.8 10*3/uL (ref 1.7–7.7)
Neutrophils Relative %: 72 %
Platelets: 261 10*3/uL (ref 150–400)
RBC: 5.03 MIL/uL (ref 3.87–5.11)
RDW: 14 % (ref 11.5–15.5)
WBC: 5.3 10*3/uL (ref 4.0–10.5)
nRBC: 0 % (ref 0.0–0.2)

## 2020-06-30 LAB — TROPONIN I (HIGH SENSITIVITY)
Troponin I (High Sensitivity): 3 ng/L (ref <18)
Troponin I (High Sensitivity): 3 ng/L (ref <18)

## 2020-06-30 MED ORDER — DICLOFENAC SODIUM 1 % EX GEL: 2.0000 g | Freq: Four times a day (QID) | CUTANEOUS | 0 refills | Status: AC | PRN

## 2020-06-30 NOTE — ED Provider Notes (Signed)
Emergency Department Provider Note   I have reviewed the triage vital signs and the nursing notes.   HISTORY  Chief Complaint Chest Pain   HPI Alexa Wilkinson is a 65 y.o. female with PMH reviewed below presents to the ED with CP which is substernal and began 30 minutes PTA. Does note some radiation into the left arm. Patient reports some burning but also pressure sensation. No pleuritic component. No fever or chills. No SOB. She took an 81 mg ASA PTA. No abdominal or back pain. No prior history of CAD.   Past Medical History:  Diagnosis Date  . Abdominal pain, periumbilic   . Hypercholesteremia   . Nausea   . No pertinent past medical history   . Ventral hernia 04/07/2011    Patient Active Problem List   Diagnosis Date Noted  . Ventral hernia 04/07/2011    Past Surgical History:  Procedure Laterality Date  . COLONOSCOPY    . HERNIA REPAIR     umb hernia age 35  . NO PAST SURGERIES    . TUBAL LIGATION    . VENTRAL HERNIA REPAIR  05/11/2011   Procedure: HERNIA REPAIR VENTRAL ADULT;  Surgeon: Wilmon Arms. Corliss Skains, MD;  Location: [facility];  Service: General;  Laterality: N/A;  open vental hernia repair    Allergies Penicillins  History reviewed. No pertinent family history.  Social History Social History   Tobacco Use  . Smoking status: Never Smoker  . Smokeless tobacco: Never Used  Substance Use Topics  . Alcohol use: No  . Drug use: No    Review of Systems  Constitutional: No fever/chills Eyes: No visual changes. ENT: No sore throat. Cardiovascular: Positive chest pain. Respiratory: Denies shortness of breath. Gastrointestinal: No abdominal pain.  No nausea, no vomiting.  No diarrhea.  No constipation. Genitourinary: Negative for dysuria. Musculoskeletal: Negative for back pain. Skin: Negative for rash. Neurological: Negative for headaches, focal weakness or numbness.  10-point ROS otherwise  negative.  ____________________________________________   PHYSICAL EXAM:  VITAL SIGNS: ED Triage Vitals  Enc Vitals Group     BP 06/30/20 0900 (!) 154/83     Pulse Rate 06/30/20 0900 (!) 58     Resp 06/30/20 0900 (!) 22     Temp 06/30/20 0957 98.4 F (36.9 C)     Temp Source 06/30/20 0957 Oral     SpO2 06/30/20 0900 100 %     Weight 06/30/20 0851 165 lb (74.8 kg)     Height 06/30/20 0851 5\' 6"  (1.676 m)   Constitutional: Alert and oriented. Well appearing and in no acute distress. Eyes: Conjunctivae are normal.  Head: Atraumatic. Nose: No congestion/rhinnorhea. Mouth/Throat: Mucous membranes are moist.  Neck: No stridor.  Cardiovascular: Normal rate, regular rhythm. Good peripheral circulation. Grossly normal heart sounds.   Respiratory: Normal respiratory effort.  No retractions. Lungs CTAB. Gastrointestinal: Soft and nontender. No distention.  Musculoskeletal: No lower extremity tenderness nor edema. No gross deformities of extremities. Neurologic:  Normal speech and language. No gross focal neurologic deficits are appreciated.  Skin:  Skin is warm, dry and intact. No rash noted.  ____________________________________________   LABS (all labs ordered are listed, but only abnormal results are displayed)  Labs Reviewed  CBC WITH DIFFERENTIAL/PLATELET  TROPONIN I (HIGH SENSITIVITY)  TROPONIN I (HIGH SENSITIVITY)   ____________________________________________  EKG   EKG Interpretation  Date/Time:  Sunday June 30 2020 08:52:44 EDT Ventricular Rate:  65 PR Interval:  178 QRS Duration: 88 QT  Interval:  401 QTC Calculation: 417 R Axis:   22 Text Interpretation: Sinus rhythm Confirmed by Alona Bene [phone]) on 06/30/2020 9:04:42 AM Also confirmed by Alona Bene [phone]), editor Elita Quick ([id number])  on 07/01/2020 7:06:19 AM       ____________________________________________  RADIOLOGY  DG Chest Portable 1 View  Result Date: 06/30/2020 CLINICAL DATA:   Chest pain EXAM: PORTABLE CHEST 1 VIEW COMPARISON:  None. FINDINGS: The heart size and mediastinal contours are within normal limits. Both lungs are clear. The visualized skeletal structures are unremarkable. IMPRESSION: No acute cardiopulmonary process. Electronically Signed   By: Genevive Bi M.D.   On: 06/30/2020 10:13    ____________________________________________   PROCEDURES  Procedure(s) performed:   Procedures  None ____________________________________________   INITIAL IMPRESSION / ASSESSMENT AND PLAN / ED COURSE  Pertinent labs & imaging results that were available during my care of the patient were reviewed by me and considered in my medical decision making (see chart for details).   Patient presents to the ED with CP starting just prior to ED arrival. EKG reviewed/interpreted by me as above and not showing signs of acute ischemia. Doubt intra-abdominal etiology of pain with no abdominal tenderness or pain.   Differential includes all life-threatening causes for chest pain. This includes but is not exclusive to acute coronary syndrome, aortic dissection, pulmonary embolism, cardiac tamponade, community-acquired pneumonia, pericarditis, musculoskeletal chest wall pain, etc.  Patient's HEART score is 4. Troponin negative x 2. Doubt PE clinically. Vitals and exam are reassuring. Plan for outpatient Cardiology referral and close PCP follow up. Referral placed in our system and contact information provided. ED return precautions discussed.   ____________________________________________  FINAL CLINICAL IMPRESSION(S) / ED DIAGNOSES  Final diagnoses:  Precordial chest pain    NEW OUTPATIENT MEDICATIONS STARTED DURING THIS VISIT:  Discharge Medication List as of 06/30/2020 12:30 PM    START taking these medications   Details  diclofenac Sodium (VOLTAREN) 1 % GEL Apply 2 g topically 4 (four) times daily as needed., Starting Sun 06/30/2020, Normal        Note:  This  document was prepared using Dragon voice recognition software and may include unintentional dictation errors.  Alona Bene, MD, [facility]    Tatiyana Foucher, Arlyss Repress, MD 07/02/20 [phone]

## 2020-06-30 NOTE — ED Notes (Signed)
Attempted to establish IV and draw blood x2, unsuccessful

## 2020-06-30 NOTE — Discharge Instructions (Signed)
You were seen in the emergency department today with chest discomfort.  Your lab work did not show findings of a heart attack.  If you develop any new or worsening symptoms you should return to the emergency department immediately.  I have arranged for you to follow with a cardiologist.  Please call the office tomorrow to confirm your follow-up appointment.

## 2020-06-30 NOTE — ED Notes (Addendum)
Attempted IV access x2, unsuccessful attempts. Infiltrated in left wrist and left AC. Blood obtained, however came out slowly

## 2020-06-30 NOTE — ED Triage Notes (Signed)
Pt reports substernal CP with radiation into left arm x 30 minutes PTA. Reports that it woke her from sleep. Denies jaw/back pain. Pt ambulatory. Denies tenderness on palpation, denies increase in pain with deep inspiration. Denies N/V, denies SOB.  Reports pain is heaviness.  81mg  asa taken PTA

## 2020-08-18 NOTE — Progress Notes (Signed)
Cardiology Office Note:    Date:  08/20/2020   ID:  Alexa Wilkinson, DOB 1955-10-17, MRN [id number]  PCP:  Lorenda Ishihara, MD   [facility] Providers Cardiologist:  None     Referring MD: Maia Plan, MD    History of Present Illness:    Alexa Wilkinson is a 65 y.o. female with a hx of HLD who was referred by Dr. Jacqulyn Bath for recent ER visit for chest pain.  Patient presented to [facility] ER on 06/30/20 with chest pain. Trop negative x2, ECG without ischemia or infarction, CXR with no acute cardiopulmonary process. She was discharged home with plans for Cardiology follow-up.  Today, the patient states that she has been having pulling sensation across her chest. The sensation has worsened with stress after the loss of her mother and husband. The pain is not exertional or positional. Has been ongoing for about 1 year. Each episode lasts about a minute before going away. Improves with sitting still and calming herself down. Usually occurs while sitting and resting. No lightheadedness, dizziness, syncope, SOB, palpitations, nausea or vomiting. Has no known personal history of CAD.  Family history: Mother with history of CVA at age 14 and CAD.  Past Medical History:  Diagnosis Date  . Abdominal pain, periumbilic   . Hypercholesteremia   . Nausea   . No pertinent past medical history   . Ventral hernia 04/07/2011    Past Surgical History:  Procedure Laterality Date  . COLONOSCOPY    . HERNIA REPAIR     umb hernia age 65  . NO PAST SURGERIES    . TUBAL LIGATION    . VENTRAL HERNIA REPAIR  05/11/2011   Procedure: HERNIA REPAIR VENTRAL ADULT;  Surgeon: Wilmon Arms. Corliss Skains, MD;  Location: [facility];  Service: General;  Laterality: N/A;  open vental hernia repair    Current Medications: Current Meds  Medication Sig  . atorvastatin (LIPITOR) 20 MG tablet Take 1 tablet by mouth at bedtime.  . Biotin 300 MCG TABS Take by mouth.  . diclofenac Sodium (VOLTAREN) 1 % GEL  Apply 2 g topically 4 (four) times daily as needed.  . fish oil-omega-3 fatty acids 1000 MG capsule Take 2 g by mouth daily.     Allergies:   Penicillins   Social History   Socioeconomic History  . Marital status: Married    Spouse name: Not on file  . Number of children: Not on file  . Years of education: Not on file  . Highest education level: Not on file  Occupational History  . Not on file  Tobacco Use  . Smoking status: Never Smoker  . Smokeless tobacco: Never Used  Substance and Sexual Activity  . Alcohol use: No  . Drug use: No  . Sexual activity: Not on file  Other Topics Concern  . Not on file  Social History Narrative  . Not on file   Social Determinants of Health   Financial Resource Strain: Not on file  Food Insecurity: Not on file  Transportation Needs: Not on file  Physical Activity: Not on file  Stress: Not on file  Social Connections: Not on file     Family History: The patient's family history is not on file.  ROS:   Please see the history of present illness.    Review of Systems  Constitutional: Negative for chills and fever.  HENT: Negative for hearing loss.   Eyes: Negative for blurred vision.  Respiratory: Negative  for shortness of breath.   Cardiovascular: Positive for chest pain. Negative for palpitations, orthopnea, claudication, leg swelling and PND.  Gastrointestinal: Negative for nausea and vomiting.  Genitourinary: Negative for hematuria.  Musculoskeletal: Negative for falls.  Neurological: Negative for dizziness and loss of consciousness.  Endo/Heme/Allergies: Negative for polydipsia.  Psychiatric/Behavioral: Negative for substance abuse.    EKGs/Labs/Other Studies Reviewed:    The following studies were reviewed today: No cardiac studies  EKG:  EKG 06/30/20 with NSR with no ischemia or infarction  Recent Labs: 06/30/2020: Hemoglobin 14.1; Platelets 261  Recent Lipid Panel No results found for: CHOL, TRIG, HDL, CHOLHDL,  VLDL, LDLCALC, LDLDIRECT   Risk Assessment/Calculations:       Physical Exam:    VS:  BP (!) 142/92   Pulse 67   Ht 5\' 6"  (1.676 m)   Wt 178 lb (80.7 kg)   SpO2 99%   BMI 28.73 kg/m     Wt Readings from Last 3 Encounters:  08/20/20 178 lb (80.7 kg)  06/30/20 165 lb (74.8 kg)  05/26/11 158 lb 3.2 oz (71.8 kg)     GEN:  Well nourished, well developed in no acute distress HEENT: Normal NECK: No JVD; No carotid bruits CARDIAC: RRR, 2/6 systolic murmur at RUSB. No rubs, gallops RESPIRATORY:  Clear to auscultation without rales, wheezing or rhonchi  ABDOMEN: Soft, non-tender, non-distended MUSCULOSKELETAL:  No edema; No deformity  SKIN: Warm and dry NEUROLOGIC:  Alert and oriented x 3 PSYCHIATRIC:  Normal affect   ASSESSMENT:    1. Precordial pain   2. Hyperlipidemia, unspecified hyperlipidemia type   3. Family history of early CAD   4. Murmur    PLAN:    In order of problems listed above:  #Non-cardiac Chest Pain: Patient with episodes of chest tightness that began about a year ago after the loss of her husband and mother. Pain is not exertional or positional. Each episode lasts about a minute before resolving. Mainly notices them when she is resting at night. When to the ER on 06/2020 where work-up was reassuring with normal trop, no ischemia/infarction on ECG, CXR without acute process. Pain sounds atypical in nature and patient is active without exertional symptoms. No stress testing indicated at this time. Will risk stratify with Ca score. -No stress testing indicated  #Systolic Murmur: -Check TTE  #HLD: #Family history of CAD LDL105 on lipitor. Will risk stratify with Ca score. -Check Ca score for risk stratification -On lipitor 20mg  daily        Medication Adjustments/Labs and Tests Ordered: Current medicines are reviewed at length with the patient today.  Concerns regarding medicines are outlined above.  Orders Placed This Encounter  Procedures  .  CT CARDIAC SCORING (SELF PAY ONLY)  . ECHOCARDIOGRAM COMPLETE   No orders of the defined types were placed in this encounter.   Patient Instructions  Medication Instructions:   Your physician recommends that you continue on your current medications as directed. Please refer to the Current Medication list given to you today.  *If you need a refill on your cardiac medications before your next appointment, please call your pharmacy*   Testing/Procedures:  CARDIAC CALCIUM SCORE TO BE DONE HERE IN THE OFFICE--SELF PAY  Your physician has requested that you have an echocardiogram. Echocardiography is a painless test that uses sound waves to create images of your heart. It provides your doctor with information about the size and shape of your heart and how well your heart's chambers and valves are  working. This procedure takes approximately one hour. There are no restrictions for this procedure.   Follow-Up: At [facility], you and your health needs are our priority.  As part of our continuing mission to provide you with exceptional heart care, we have created designated Provider Care Teams.  These Care Teams include your primary Cardiologist (physician) and Advanced Practice Providers (APPs -  Physician Assistants and Nurse Practitioners) who all work together to provide you with the care you need, when you need it.  We recommend signing up for the patient portal called "MyChart".  Sign up information is provided on this After Visit Summary.  MyChart is used to connect with patients for Virtual Visits (Telemedicine).  Patients are able to view lab/test results, encounter notes, upcoming appointments, etc.  Non-urgent messages can be sent to your provider as well.   To learn more about what you can do with MyChart, go to [email or web address].    Your next appointment:   1 year(s)  The format for your next appointment:   In Person  Provider:   Laurance Flatten, MD   Other  Instructions  PLEASE MONITOR AND RECORD YOUR BLOOD PRESSURE AND TAKE THIS INTO YOUR PCP APPOINTMENT WITH YOU PER DR. Shari Prows     Signed, Meriam Sprague, MD  08/20/2020 12:46 PM    [facility]

## 2020-08-20 ENCOUNTER — Ambulatory Visit
Admission: RE | Admit: 2020-08-20 | Discharge: 2020-08-20 | Disposition: A | Discharge status: Home / Self Care | Payer: BC Managed Care – PPO | Source: Ambulatory Visit | Attending: Internal Medicine | Admitting: Internal Medicine

## 2020-08-20 ENCOUNTER — Other Ambulatory Visit: Payer: Self-pay

## 2020-08-20 ENCOUNTER — Encounter: Payer: Self-pay | Admitting: Cardiology

## 2020-08-20 ENCOUNTER — Ambulatory Visit: Payer: BC Managed Care – PPO | Admitting: Cardiology

## 2020-08-20 VITALS — BP 142/92 | HR 67 | Ht 66.0 in | Wt 178.0 lb

## 2020-08-20 DIAGNOSIS — E785 Hyperlipidemia, unspecified: Secondary | ICD-10-CM

## 2020-08-20 DIAGNOSIS — Z8249 Family history of ischemic heart disease and other diseases of the circulatory system: Secondary | ICD-10-CM

## 2020-08-20 DIAGNOSIS — Z1231 Encounter for screening mammogram for malignant neoplasm of breast: Secondary | ICD-10-CM

## 2020-08-20 DIAGNOSIS — R072 Precordial pain: Secondary | ICD-10-CM | POA: Diagnosis not present

## 2020-08-20 DIAGNOSIS — R011 Cardiac murmur, unspecified: Secondary | ICD-10-CM

## 2020-08-20 NOTE — Patient Instructions (Signed)
Medication Instructions:   Your physician recommends that you continue on your current medications as directed. Please refer to the Current Medication list given to you today.  *If you need a refill on your cardiac medications before your next appointment, please call your pharmacy*   Testing/Procedures:  CARDIAC CALCIUM SCORE TO BE DONE HERE IN THE OFFICE--SELF PAY  Your physician has requested that you have an echocardiogram. Echocardiography is a painless test that uses sound waves to create images of your heart. It provides your doctor with information about the size and shape of your heart and how well your heart's chambers and valves are working. This procedure takes approximately one hour. There are no restrictions for this procedure.   Follow-Up: At [facility], you and your health needs are our priority.  As part of our continuing mission to provide you with exceptional heart care, we have created designated Provider Care Teams.  These Care Teams include your primary Cardiologist (physician) and Advanced Practice Providers (APPs -  Physician Assistants and Nurse Practitioners) who all work together to provide you with the care you need, when you need it.  We recommend signing up for the patient portal called "MyChart".  Sign up information is provided on this After Visit Summary.  MyChart is used to connect with patients for Virtual Visits (Telemedicine).  Patients are able to view lab/test results, encounter notes, upcoming appointments, etc.  Non-urgent messages can be sent to your provider as well.   To learn more about what you can do with MyChart, go to [email or web address].    Your next appointment:   1 year(s)  The format for your next appointment:   In Person  Provider:   Laurance Flatten, MD   Other Instructions  PLEASE MONITOR AND RECORD YOUR BLOOD PRESSURE AND TAKE THIS INTO YOUR PCP APPOINTMENT WITH YOU PER DR. Shari Prows

## 2020-09-02 ENCOUNTER — Telehealth: Payer: Self-pay | Admitting: Cardiology

## 2020-09-02 NOTE — Telephone Encounter (Signed)
Spoke with the pt and informed her that there is no contraindication with getting the covid booster shot, and proceeding with scheduled echo and calcium score on 6/16. Informed the pt that she is safe to proceed with both test scheduled, and to get her booster shot as scheduled.  Pt verbalized understanding and agrees with this plan.

## 2020-09-02 NOTE — Telephone Encounter (Signed)
Patient is getting her COVID booster shot Wed 09/04/20. Patient wanted to know if she could still have her tests done 09/12/20. Please advise

## 2020-09-12 ENCOUNTER — Ambulatory Visit (INDEPENDENT_AMBULATORY_CARE_PROVIDER_SITE_OTHER)
Admission: RE | Admit: 2020-09-12 | Discharge: 2020-09-12 | Disposition: A | Discharge status: Home / Self Care | Payer: Self-pay | Source: Ambulatory Visit | Attending: Cardiology | Admitting: Cardiology

## 2020-09-12 ENCOUNTER — Ambulatory Visit (HOSPITAL_COMMUNITY): Payer: BC Managed Care – PPO | Attending: Internal Medicine

## 2020-09-12 ENCOUNTER — Other Ambulatory Visit: Payer: Self-pay

## 2020-09-12 DIAGNOSIS — E785 Hyperlipidemia, unspecified: Secondary | ICD-10-CM | POA: Diagnosis not present

## 2020-09-12 DIAGNOSIS — Z8249 Family history of ischemic heart disease and other diseases of the circulatory system: Secondary | ICD-10-CM | POA: Diagnosis not present

## 2020-09-12 DIAGNOSIS — R011 Cardiac murmur, unspecified: Secondary | ICD-10-CM | POA: Diagnosis not present

## 2020-09-12 LAB — ECHOCARDIOGRAM COMPLETE
Area-P 1/2: 3.13 cm2
S' Lateral: 2.8 cm

## 2021-07-15 ENCOUNTER — Other Ambulatory Visit: Payer: Self-pay | Admitting: Internal Medicine

## 2021-07-15 DIAGNOSIS — Z1231 Encounter for screening mammogram for malignant neoplasm of breast: Secondary | ICD-10-CM

## 2021-08-21 ENCOUNTER — Ambulatory Visit
Admission: RE | Admit: 2021-08-21 | Discharge: 2021-08-21 | Disposition: A | Discharge status: Home / Self Care | Payer: BC Managed Care – PPO | Source: Ambulatory Visit | Attending: Internal Medicine | Admitting: Internal Medicine

## 2021-08-21 DIAGNOSIS — Z1231 Encounter for screening mammogram for malignant neoplasm of breast: Secondary | ICD-10-CM

## 2022-03-17 ENCOUNTER — Other Ambulatory Visit: Payer: Self-pay | Admitting: Internal Medicine

## 2022-03-17 DIAGNOSIS — M8588 Other specified disorders of bone density and structure, other site: Secondary | ICD-10-CM

## 2022-03-31 ENCOUNTER — Ambulatory Visit
Admission: RE | Admit: 2022-03-31 | Discharge: 2022-03-31 | Disposition: A | Discharge status: Home / Self Care | Payer: BC Managed Care – PPO | Source: Ambulatory Visit | Attending: Internal Medicine | Admitting: Internal Medicine

## 2022-03-31 DIAGNOSIS — M8588 Other specified disorders of bone density and structure, other site: Secondary | ICD-10-CM

## 2022-07-28 ENCOUNTER — Other Ambulatory Visit: Payer: Self-pay | Admitting: Internal Medicine

## 2022-07-28 DIAGNOSIS — Z1231 Encounter for screening mammogram for malignant neoplasm of breast: Secondary | ICD-10-CM

## 2022-08-31 ENCOUNTER — Ambulatory Visit
Admission: RE | Admit: 2022-08-31 | Discharge: 2022-08-31 | Disposition: A | Discharge status: Home / Self Care | Payer: BC Managed Care – PPO | Source: Ambulatory Visit | Attending: Internal Medicine | Admitting: Internal Medicine

## 2022-08-31 DIAGNOSIS — Z1231 Encounter for screening mammogram for malignant neoplasm of breast: Secondary | ICD-10-CM

## 2022-09-05 LAB — GLUCOSE, POCT (MANUAL RESULT ENTRY): Glucose Fasting, POC: 96 mg/dL (ref 70–99)

## 2022-09-05 NOTE — Progress Notes (Signed)
Pt has PCP. No SDOH needs at this time. Pt will follow up with PCP about BP

## 2022-09-08 ENCOUNTER — Encounter: Payer: Self-pay | Admitting: *Deleted

## 2022-09-08 NOTE — Progress Notes (Unsigned)
Pt attended 09/05/22 screening event where her b/p was 155/90 and her blood sugar was 96. At the event, pt confirmed her PCP is Dr. Lorenda Ishihara at [facility]) and did not identify any SDOH insecurties at that time. Event nurse noted pt "will follow up with PCP about her b/p." Chart review indicates pt saw her PCP on 03/20/22 and that she has an upcoming appt with her PCP on 09/15/22.

## 2023-06-05 ENCOUNTER — Emergency Department (HOSPITAL_BASED_OUTPATIENT_CLINIC_OR_DEPARTMENT_OTHER)
Admission: EM | Admit: 2023-06-05 | Discharge: 2023-06-05 | Disposition: A | Discharge status: Home / Self Care | Payer: Self-pay | Attending: Emergency Medicine | Admitting: Emergency Medicine

## 2023-06-05 ENCOUNTER — Encounter (HOSPITAL_BASED_OUTPATIENT_CLINIC_OR_DEPARTMENT_OTHER): Payer: Self-pay

## 2023-06-05 ENCOUNTER — Other Ambulatory Visit: Payer: Self-pay

## 2023-06-05 DIAGNOSIS — Z79899 Other long term (current) drug therapy: Secondary | ICD-10-CM | POA: Insufficient documentation

## 2023-06-05 DIAGNOSIS — I10 Essential (primary) hypertension: Secondary | ICD-10-CM | POA: Insufficient documentation

## 2023-06-05 DIAGNOSIS — R040 Epistaxis: Secondary | ICD-10-CM | POA: Insufficient documentation

## 2023-06-05 MED ORDER — OXYMETAZOLINE HCL 0.05 % NA SOLN
1.0000 | Freq: Once | NASAL | Status: AC
  Administered 2023-06-05: 1 via NASAL
  Filled 2023-06-05: qty 30

## 2023-06-05 MED ORDER — AMLODIPINE BESYLATE 5 MG PO TABS: 5.0000 mg | ORAL_TABLET | Freq: Every day | ORAL | 0 refills | Status: AC

## 2023-06-05 NOTE — ED Notes (Signed)
 No bleeding noted after medication.

## 2023-06-05 NOTE — Discharge Instructions (Addendum)
 If your nosebleed recurs, control with pressure and can try Afrin in each nare.  If you have continued ongoing bleeding despite pressure, return to the emergency department for further management of your nosebleed.  Your blood pressure was high today which can cause nosebleeds.  Follow-up with your primary care provider, you have been started on an antihypertensive medication.

## 2023-06-05 NOTE — ED Triage Notes (Signed)
 Says she woke up to take cholesterol medications and noticed nose was bleeding when she laid down.   Denies anticoagulants.

## 2023-06-05 NOTE — ED Provider Notes (Signed)
 [facility] EMERGENCY DEPARTMENT AT [facility] Provider Note   CSN: [id number] Arrival date & time: 06/05/23  0359     History  Chief Complaint  Patient presents with   Epistaxis    LULANI BOUR is a 68 y.o. female.   Epistaxis    68 year old female presenting to the emergency department with concern for epistaxis.  The patient states that she woke up this morning to take her blood pressure medications and noticed that she was bleeding from her naris bilaterally.  She was also having blood into her mouth.  She is not on anticoagulation.  She is not on any antihypertensive medications.  She does not think her nose.  No falls or trauma.  Bleeding has since stopped with pressure.  Home Medications Prior to Admission medications   Medication Sig Start Date End Date Taking? Authorizing Provider  amLODipine (NORVASC) 5 MG tablet Take 1 tablet (5 mg total) by mouth daily. 06/05/23 07/05/23 Yes Ernie Avena, MD  atorvastatin (LIPITOR) 20 MG tablet Take 1 tablet by mouth at bedtime. 08/10/20   [provider]  Biotin 300 MCG TABS Take by mouth.    [provider]  diclofenac Sodium (VOLTAREN) 1 % GEL Apply 2 g topically 4 (four) times daily as needed. 06/30/20   Long, Arlyss Repress, MD  fish oil-omega-3 fatty acids 1000 MG capsule Take 2 g by mouth daily.    [provider]      Allergies    Penicillins    Review of Systems   Review of Systems  HENT:  Positive for nosebleeds.   All other systems reviewed and are negative.   Physical Exam Updated Vital Signs BP (!) 173/102   Pulse 100   Temp 97.6 F (36.4 C) (Axillary)   Resp (!) 22   Ht 5\' 6"  (1.676 m)   Wt 77.1 kg   SpO2 97%   BMI 27.44 kg/m  Physical Exam Vitals and nursing note reviewed.  Constitutional:      General: She is not in acute distress. HENT:     Head: Normocephalic and atraumatic.     Comments: Dried blood in the nares bilaterally, dried blood in the oropharynx, no obvious  source of bleeding Eyes:     Conjunctiva/sclera: Conjunctivae normal.     Pupils: Pupils are equal, round, and reactive to light.  Cardiovascular:     Rate and Rhythm: Normal rate and regular rhythm.  Pulmonary:     Effort: Pulmonary effort is normal. No respiratory distress.  Abdominal:     General: There is no distension.     Tenderness: There is no guarding.  Musculoskeletal:        General: No deformity or signs of injury.     Cervical back: Neck supple.  Skin:    Findings: No lesion or rash.  Neurological:     General: No focal deficit present.     Mental Status: She is alert. Mental status is at baseline.     ED Results / Procedures / Treatments   Labs (all labs ordered are listed, but only abnormal results are displayed) Labs Reviewed - No data to display  EKG None  Radiology No results found.  Procedures Procedures    Medications Ordered in ED Medications  oxymetazoline (AFRIN) 0.05 % nasal spray 1 spray (1 spray Each Nare Given 06/05/23 0449)    ED Course/ Medical Decision Making/ A&P  Medical Decision Making Risk OTC drugs. Prescription drug management.    68 year old female presenting to the emergency department with concern for epistaxis.  The patient states that she woke up this morning to take her blood pressure medications and noticed that she was bleeding from her naris bilaterally.  She was also having blood into her mouth.  She is not on anticoagulation.  She is not on any antihypertensive medications.  She does not think her nose.  No falls or trauma.  Bleeding has since stopped with pressure.  On arrival, the patient was vitally stable, initially hypertensive BP 185/103, improved to 173/102, afebrile, saturating well on room air.  Physical exam revealed evidence of epistaxis which has since resolved.  No active epistaxis noted on exam.  Patient was administered Afrin in each nare.  She was observed in the emergency  department for close to 2 hours with no recurrence of bleeding.  She was hypertensive and is not currently on any antihypertensive medications.  Per the hypertension treatment algorithm, will discharge the patient on amlodipine and have her follow-up with her primary care provider.  Advised that the patient try Afrin and pressure at home for any recurrence of bleeding, return to the emergency department for persistent bleeding lasting longer than 15 minutes requiring further epistaxis management.  Stable for discharge.   Final Clinical Impression(s) / ED Diagnoses Final diagnoses:  Epistaxis  Hypertension, unspecified type    Rx / DC Orders ED Discharge Orders          Ordered    amLODipine (NORVASC) 5 MG tablet  Daily        06/05/23 0538              Ernie Avena, MD 06/05/23 [phone]

## 2023-08-18 ENCOUNTER — Other Ambulatory Visit: Payer: Self-pay | Admitting: Internal Medicine

## 2023-08-18 DIAGNOSIS — Z1231 Encounter for screening mammogram for malignant neoplasm of breast: Secondary | ICD-10-CM

## 2023-08-27 ENCOUNTER — Institutional Professional Consult (permissible substitution) (INDEPENDENT_AMBULATORY_CARE_PROVIDER_SITE_OTHER): Payer: Self-pay | Admitting: Otolaryngology

## 2023-09-13 ENCOUNTER — Ambulatory Visit
Admission: RE | Admit: 2023-09-13 | Discharge: 2023-09-13 | Disposition: A | Discharge status: Home / Self Care | Payer: Self-pay | Source: Ambulatory Visit | Attending: Internal Medicine | Admitting: Internal Medicine

## 2023-09-13 DIAGNOSIS — Z1231 Encounter for screening mammogram for malignant neoplasm of breast: Secondary | ICD-10-CM
